# Patient Record
Sex: Male | Born: 1990 | Race: Black or African American | Hispanic: No | Marital: Single | Smoking: Current every day smoker
Health system: Southern US, Community
[De-identification: ages and names within clinical notes are randomized; demographics above are authoritative.]

---

## 2018-07-15 ENCOUNTER — Emergency Department (HOSPITAL_COMMUNITY)
Admission: EM | Admit: 2018-07-15 | Discharge: 2018-07-15 | Disposition: A | Discharge status: Home / Self Care | Payer: Self-pay | Attending: Emergency Medicine | Admitting: Emergency Medicine

## 2018-07-15 ENCOUNTER — Emergency Department (HOSPITAL_COMMUNITY): Payer: Self-pay

## 2018-07-15 ENCOUNTER — Other Ambulatory Visit: Payer: Self-pay

## 2018-07-15 ENCOUNTER — Encounter (HOSPITAL_COMMUNITY): Payer: Self-pay | Admitting: Emergency Medicine

## 2018-07-15 DIAGNOSIS — Y929 Unspecified place or not applicable: Secondary | ICD-10-CM | POA: Insufficient documentation

## 2018-07-15 DIAGNOSIS — F17210 Nicotine dependence, cigarettes, uncomplicated: Secondary | ICD-10-CM | POA: Insufficient documentation

## 2018-07-15 DIAGNOSIS — S322XXA Fracture of coccyx, initial encounter for closed fracture: Secondary | ICD-10-CM | POA: Insufficient documentation

## 2018-07-15 DIAGNOSIS — W500XXA Accidental hit or strike by another person, initial encounter: Secondary | ICD-10-CM | POA: Insufficient documentation

## 2018-07-15 DIAGNOSIS — Y9361 Activity, american tackle football: Secondary | ICD-10-CM | POA: Insufficient documentation

## 2018-07-15 DIAGNOSIS — F1290 Cannabis use, unspecified, uncomplicated: Secondary | ICD-10-CM | POA: Insufficient documentation

## 2018-07-15 DIAGNOSIS — Y999 Unspecified external cause status: Secondary | ICD-10-CM | POA: Insufficient documentation

## 2018-07-15 MED ORDER — CYCLOBENZAPRINE HCL 10 MG PO TABS: 10.0000 mg | ORAL_TABLET | Freq: Two times a day (BID) | ORAL | 0 refills | Status: AC | PRN

## 2018-07-15 MED ORDER — IBUPROFEN 800 MG PO TABS: 800.0000 mg | ORAL_TABLET | Freq: Three times a day (TID) | ORAL | 0 refills | Status: AC | PRN

## 2018-07-15 NOTE — ED Triage Notes (Signed)
Pt with reported tailbone pain from basketball injury last week. Reports pain radiates down bilateral extremities depending on what he's doing (sitting vs standing).

## 2018-07-15 NOTE — Discharge Instructions (Signed)
You can use a donut (ring cushion) or wedge cushion for comfort with sitting.  Get rechecked immediately if you develop numbness, weakness, difficulty with urination or new concerning symptoms.

## 2018-07-15 NOTE — ED Provider Notes (Signed)
[facility] EMERGENCY DEPARTMENT Provider Note   CSN: [id number] Arrival date & time: 07/15/18  1051     History   Chief Complaint Chief Complaint  Patient presents with  . Back Pain    HPI Walter Solomon is a 28 y.o. male.  The history is provided by the patient. No language interpreter was used.  Back Pain   Walter Solomon is a 28 y.o. male who presents to the Emergency Department complaining of back pain. He presents to the emergency department for evaluation of low back pain/tailbone pain that began one week ago. He was playing football when he received a direct blow to his midline tailbone. He experienced immediate pain. Pain is located in his tailbone and at times radiates down to bilateral thighs. Pain is resolved when laying supine. It returns and worsened with sitting, standing in ambulation. He denies any fevers, numbness, weakness, nausea, vomiting, dysuria, urinary incontinence, hematuria. No prior similar symptoms. He has no medical problems. He has been taking ibuprofen at home with transient improvement in his symptoms. He also complains of increased pain with defecation. History reviewed. No pertinent past medical history.  There are no active problems to display for this patient.   History reviewed. No pertinent surgical history.      Home Medications    Prior to Admission medications   Medication Sig Start Date End Date Taking? Authorizing Provider  cyclobenzaprine (FLEXERIL) 10 MG tablet Take 1 tablet (10 mg total) by mouth 2 (two) times daily as needed for muscle spasms. 07/15/18   Tilden Fossa, MD  ibuprofen (ADVIL,MOTRIN) 800 MG tablet Take 1 tablet (800 mg total) by mouth every 8 (eight) hours as needed. 07/15/18   Tilden Fossa, MD    Family History No family history on file.  Social History Social History   Tobacco Use  . Smoking status: Current Every Day Smoker    Types: Cigarettes  . Smokeless tobacco: Never Used    Substance Use Topics  . Alcohol use: Not Currently  . Drug use: Yes    Types: Marijuana     Allergies   Patient has no known allergies.   Review of Systems Review of Systems  Musculoskeletal: Positive for back pain.  All other systems reviewed and are negative.    Physical Exam Updated Vital Signs BP (!) 157/76   Pulse 64   Temp 98.4 F (36.9 Solomon) (Oral)   Resp 17   SpO2 100%   Physical Exam Vitals signs and nursing note reviewed.  Constitutional:      General: He is not in acute distress.    Appearance: Normal appearance.  HENT:     Head: Normocephalic and atraumatic.  Neck:     Musculoskeletal: Neck supple.  Cardiovascular:     Rate and Rhythm: Normal rate and regular rhythm.  Pulmonary:     Effort: Pulmonary effort is normal. No respiratory distress.  Abdominal:     General: There is no distension.     Palpations: Abdomen is soft.     Tenderness: There is no abdominal tenderness. There is no guarding or rebound.  Musculoskeletal: Normal range of motion.     Comments: 2+ DP pulses bilaterally. There is mild tenderness to palpation over the lower sacrum. There is no midline lumbar or thoracic tenderness to palpation.  Skin:    General: Skin is warm and dry.     Capillary Refill: Capillary refill takes less than 2 seconds.  Neurological:     Mental  Status: He is alert and oriented to person, place, and time.     Comments: Normal gait. Five out of five strength and bilateral lower extremities with sensation to light touch intact and bilateral lower extremities. 2+ patellar reflexes bilaterally.  Psychiatric:        Mood and Affect: Mood normal.        Behavior: Behavior normal.      ED Treatments / Results  Labs (all labs ordered are listed, but only abnormal results are displayed) Labs Reviewed - No data to display  EKG None  Radiology Dg Sacrum/coccyx  Result Date: 07/15/2018 CLINICAL DATA:  Acute sacrum/coccyx pain following injury 1 week ago.  Initial encounter. EXAM: SACRUM AND COCCYX - 2+ VIEW COMPARISON:  None. FINDINGS: Mild cortical irregularity of an UPPER coccygeal segment is equivocal for nondisplaced fracture. No other fracture or bony abnormality noted. IMPRESSION: Equivocal nondisplaced fracture of the UPPER coccyx. Electronically Signed   By: Harmon PierJeffrey  Hu M.D.   On: 07/15/2018 11:40    Procedures Procedures (including critical care time)  Medications Ordered in ED Medications - No data to display   Initial Impression / Assessment and Plan / ED Course  I have reviewed the triage vital signs and the nursing notes.  Pertinent labs & imaging results that were available during my care of the patient were reviewed by me and considered in my medical decision making (see chart for details).     Patient here for evaluation of tailbone pain after a direct blow one week ago. He is neurovascularly  intact on examination. Imaging is concerning for nondisplaced coccyx fracture. Discussed with patient home care for coccyx fracture. Discussed outpatient follow-up and return precautions. Final Clinical Impressions(s) / ED Diagnoses   Final diagnoses:  Closed fracture of coccyx, initial encounter [facility](HCC)    ED Discharge Orders         Ordered    cyclobenzaprine (FLEXERIL) 10 MG tablet  2 times daily PRN     07/15/18 1154    ibuprofen (ADVIL,MOTRIN) 800 MG tablet  Every 8 hours PRN     07/15/18 1154           Tilden Fossaees, Lorenda Grecco, MD 07/15/18 1157

## 2018-07-15 NOTE — ED Notes (Signed)
Patient transported to X-ray 

## 2018-07-15 NOTE — ED Notes (Signed)
Pt back from X-ray.  

## 2019-08-17 IMAGING — DX DG SACRUM/COCCYX 2+V
3 series · 3 of 3 positions shown · non-contrast
Comparison: None.

CLINICAL DATA: Acute sacrum/coccyx pain following injury 1 week
ago. Initial encounter.

EXAM:
SACRUM AND COCCYX - 2+ VIEW

[coccyx ap]
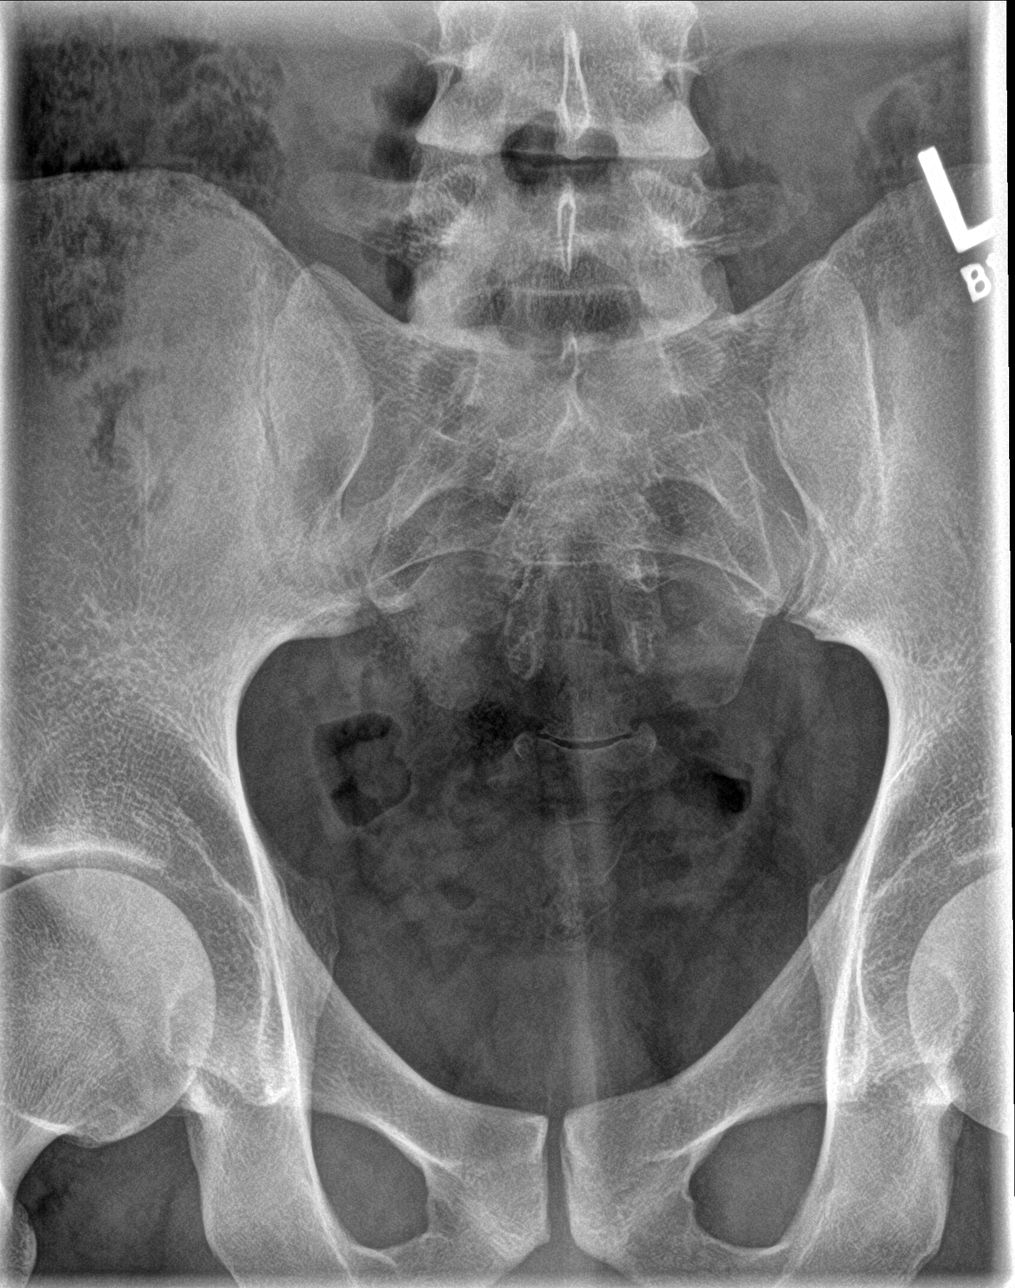

[sacrum ap]
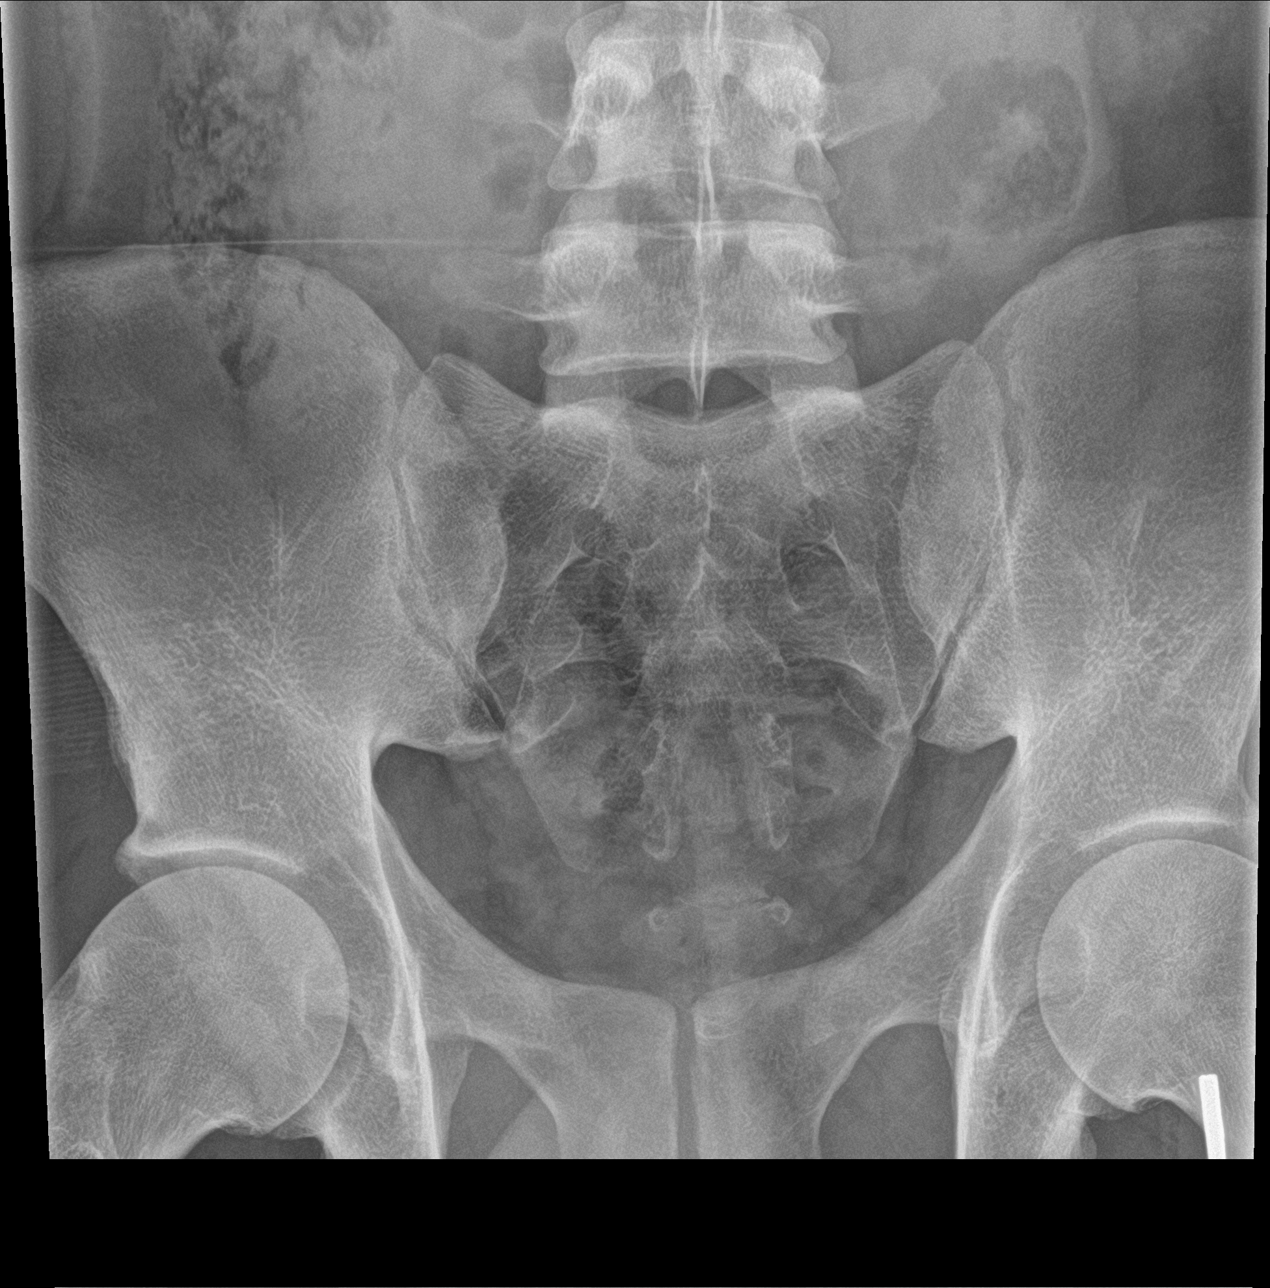

[sacrum lat]
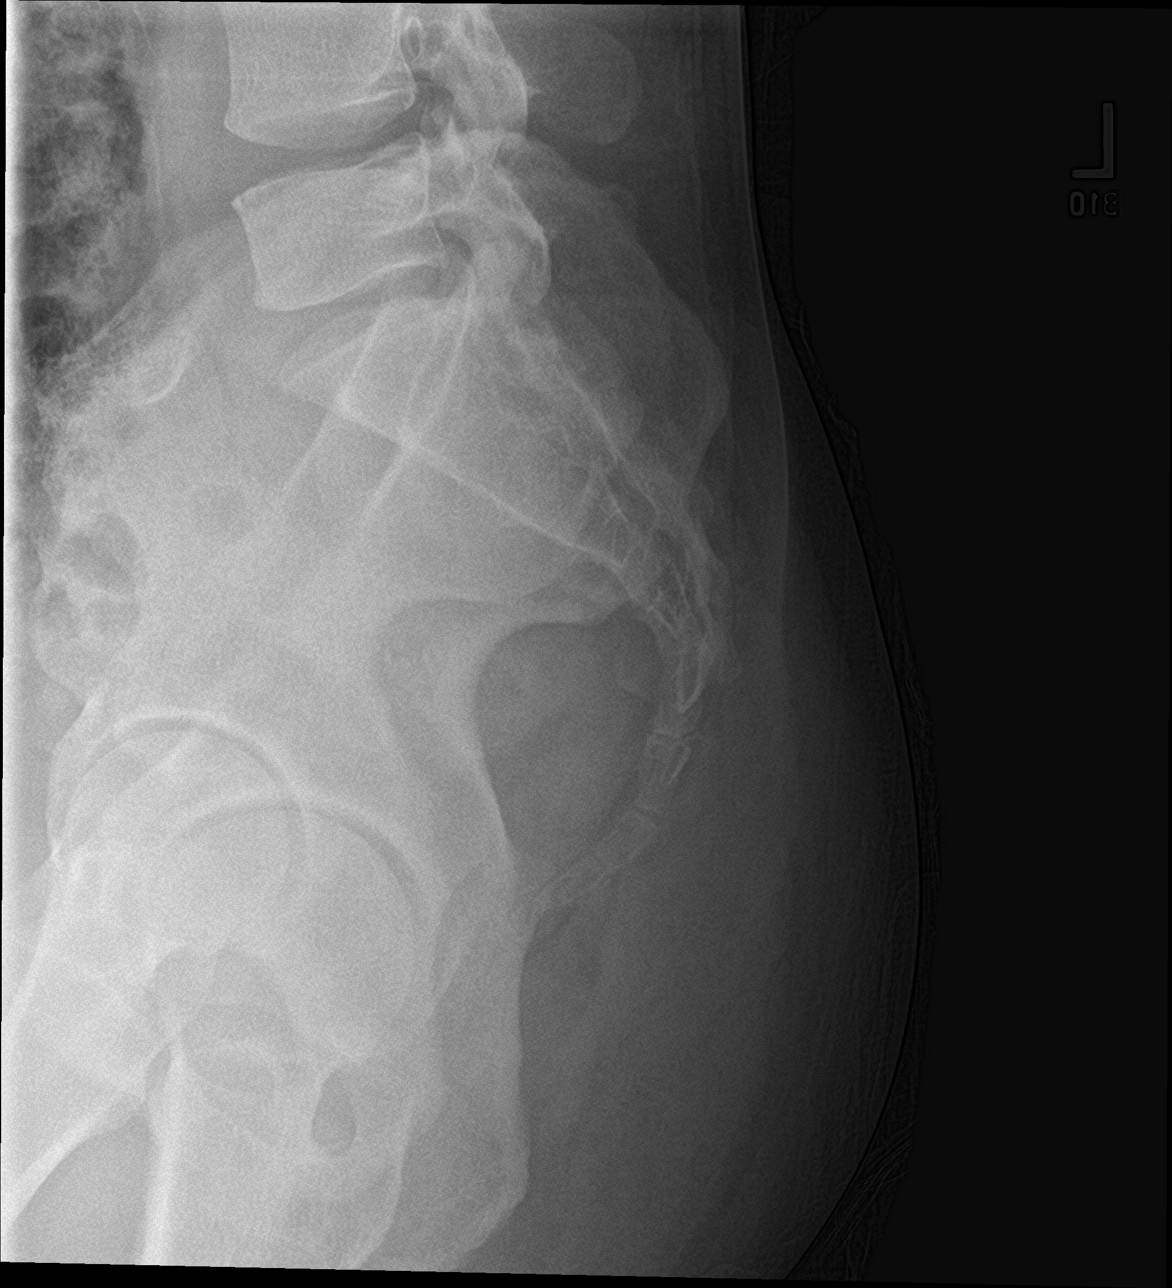

[3 of 3 positions shown; findings below may reference images not displayed]

FINDINGS: Mild cortical irregularity of an UPPER coccygeal segment is
equivocal for nondisplaced fracture..

No other fracture or bony abnormality noted.
IMPRESSION: Equivocal nondisplaced fracture of the UPPER coccyx.
